# Patient Record
Sex: Male | Born: 2005 | Race: White | Hispanic: No | Marital: Single | Smoking: Never smoker
Health system: Southern US, Community
[De-identification: ages and names within clinical notes are randomized; demographics above are authoritative.]

## PROBLEM LIST (undated history)

## (undated) HISTORY — PX: OTHER SURGICAL HISTORY: SHX169

---
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
## 2024-09-21 NOTE — Progress Notes (Signed)
 Otolaryngology Clinic Note  HPI:    New Patient (Pt presents for bilateral ear infection. Swollen tonsils and sinus drainage )    Johnny Hanson is a 18 y.o. male who presents as a new patient, for evaluation and treatment of right ear clogging.  He has been experiencing a sensation of blockage in his right ear for approximately 3 to 4 weeks. He reports no preceding cold or congestion symptoms. He also reports difficulty hearing in the affected ear, describing it as feeling like there is an earplug inserted. He has a history of allergies and has undergone allergy testing in the past. He did not receive allergy injections during his childhood. His symptoms tend to worsen with changes in weather. He does not use any nasal sprays or allergy medications.   He has sought medical attention at an urgent care facility twice. During his first visit, he received a steroid injection due to swollen tonsils. On his second visit, which was about 1.5 weeks ago, he was prescribed prednisone  and an antibiotic for his ear condition, but these treatments did not alleviate his symptoms.  He is not currently using any intranasal steroid sprays.  He does not take Claritin or Zyrtec . He has a history of a broken nose from a fall on concrete when he was younger.  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  PMH/Meds/All/SocHx/FamHx/ROS:   Medical History[1]  Surgical History[2]  No family history of bleeding disorders, wound healing problems or difficulty with anesthesia.   Social History   Socioeconomic History   Marital status: Single    Spouse name: Not on file   Number of children: Not on file   Years of education: Not on file   Highest education level: Not on file  Occupational History   Not on file  Tobacco Use   Smoking status: Never   Smokeless tobacco: Never  Substance and Sexual Activity   Alcohol use: No   Drug use: Not on file   Sexual activity: Not on file  Other Topics Concern    Not on file  Social History Narrative   Not on file   Social Drivers of Health   Food Insecurity: Not on file  Transportation Needs: Not on file  Safety: Not on file  Living Situation: Not on file    Current Medications[3]  A complete ROS was performed with pertinent positives/negatives noted in the HPI. The remainder of the ROS are negative.    Physical Exam:    BP (!) 160/90   Pulse 101   Temp 97.5 F (36.4 C) (Temporal)   Ht 1.905 m (6' 3)   Wt (!) 176 kg (388 lb 9.6 oz)   BMI 48.57 kg/m    General: Well developed, well nourished. No acute distress.   Head/Face: Normocephalic, atraumatic. No scars or lesions. No sinus tenderness. Facial nerve intact and equal bilaterally.   Eyes: Pupils are equal, round and reactive to light. Conjunctiva and lids are normal. Normal extraocular mobility.   Ears:   Right: Pinna and external meatus normal, normal ear canal skin and caliber without excessive cerumen or drainage. Tympanic membrane intact with serous effusion.   Left: Pinna and external meatus normal, normal ear canal skin and caliber without excessive cerumen or drainage. Tympanic membrane intact without effusion or infection.   Nose: No gross deformity or lesions. No purulent discharge.   Mouth/Oropharynx: Lips normal, teeth and gums normal with good dentition, normal oral vestibule. Normal floor of mouth, tongue and oral mucosa, no  mucosal lesions, ulcer or mass, normal tongue mobility. Uvula midline. Hard and soft palate normal with normal mobility. 3+ tonsils, no erythema or exudate.  Larynx: See TFL if applicable  Nasopharynx: See TFL if applicable  Neck: Trachea midline. No masses. No crepitus. Normal thyroid glad palpation without thyromegaly or nodules palpated. Salivary glands normal to palpation without swelling, erythema or mass.   Lymphatic: No lymphadenopathy in the neck.  Respiratory: No stridor or distress.  Extremities: No edema or cyanosis. Warm and  well-perfused.  Neurologic: CN II-XII intact. Alert and oriented to self, place and time.  Normal reflexes and motor skills, balance and coordination. Moving all extremities without gross abnormality.  Psychiatric:  No unusual anxiety or evidence of depression. Appropriate affect.    Independent Review of Additional Tests or Records:  None  Procedures:   Binocular Microscopy  Date/Time: 09/21/2024 11:30 AM  Performed by: Gerard Jenkins Shope, DO Authorized by: Gerard Jenkins Shope, DO   Consent:    Consent obtained:  Verbal   Consent given by:  Patient Procedure details:    Indications: examination of tympanic membrane     Scope location: bilateral   Nasal/Sinus Endoscopy  Date/Time: 09/21/2024 11:30 AM  Performed by: Gerard Jenkins Shope, DO Authorized by: Gerard Jenkins Skotnicki, DO  Local anesthesia used: no  Anesthesia: Local anesthesia used: no  Sedation: Patient sedated: no  Comments: Procedure Note - Nasal Endoscopy   Risks/benefits and possible complications of this procedure were discussed in detail and the patient understood and agreed to proceed.  With the patient in the upright position, the flexible endscope was inserted into the nasal passage bilaterally.  Where visible, nasal secretions and mucosal crusting were removed with suction. The overall appearance of the nasal cavity and paranasal sinuses were noted and the findings are described below.  Findings: Septal deviation noted with bony spurring bilaterally, significant allergic edema with clear rhinorrhea.  No evidence of mucopurulent drainage or nasal polyps.  Fossa of Rosenmuller normal, nasopharynx normal   The patient tolerated the procedure without difficulty.         Impression & Plans:  Johnny Hanson is a 18 y.o. male with:  1. Right ear clogging. The right ear clogging is likely due to a recent cold or upper respiratory tract infection, rather than an active infection. The presence of  clear fluid behind the eardrum suggests that it may take up to 3 months for complete resolution. The previously prescribed steroids have been beneficial, but additional doses are not recommended at this time. The enlarged tonsils are not contributing to the ear drainage issue. A prescription for Flonase  nasal spray will be provided, to be used daily for the next few months. Gently popping the ears and using Zyrtec  or Claritin at night before bedtime is advised to help dry up the drainage. If the fluid does not clear on its own within the next few weeks, the option of inserting a tube in the ear to facilitate fluid drainage will be considered.  2. Deviated septum. The nasal septum is deviated, causing significant clear drainage and narrowing of the nasal passages on both sides. There are no polyps or masses present. The deviated septum may be contributing to the ear drainage issue. The use of Flonase  nasal spray daily for the next few months is recommended to reduce swelling and improve nasal breathing. If nasal congestion persists despite the use of Flonase , the option of septum correction will be discussed.  3.  Tonsillar hypertrophy Tonsils are 3+,  symmetric, with no evidence of inflammation, erythema or edema.  Patient was counseled that tonsillar hypertrophy as an isolated finding does not warrant any additional treatment or investigation.  However, patient may benefit from dedicated sleep study at some point in the future and to ensure there is not obstructive sleep apnea present.  A follow-up appointment is scheduled for 2 months from now.   Meghan Jenkins Shope, DO Otolaryngology         [1] No past medical history on file. [2] Past Surgical History: Procedure Laterality Date   ADENOIDECTOMY     Procedure: ADENOIDECTOMY   OTHER SURGICAL HISTORY     Procedure: OTHER SURGICAL HISTORY (Broke both arms)   OTHER SURGICAL HISTORY     Procedure: OTHER SURGICAL HISTORY (Broke arm);  Both arms, had to have left arm surgery  [3]  Current Outpatient Medications:    cetirizine  (ZyrTEC ) 10 mg tablet, Take  by mouth. (Patient not taking: Reported on 09/21/2024), Disp: , Rfl:    fluticasone  propionate (FLONASE ) 50 mcg/spray nasal spray, 2 sprays into each nostril once daily or 1 spray into each nostril twice daily., Disp: 16 g, Rfl: 11   mometasone (NASONEX) 50 mcg/actuation spry spray, Administer 2 sprays into affected nostril(s). (Patient not taking: Reported on 09/21/2024), Disp: , Rfl:

## 2025-01-05 NOTE — Progress Notes (Signed)
 Otolaryngology Clinic Note  HPI:    Enlarged Tonsils (Pt presents today for enlarged tonsils.)  History of Present Illness [name] is a 19 year old male is a male who presents for evaluation of swollen tonsils and sore throat for 2 days duration.  He is a return patient and is also accompanied by his girlfriend but is a provide his own history of today's exam.  He has a history of strep throat and has previously sought treatment at an urgent care facility for viral infections and sore throat. He was previously evaluated by Dr. Amaryllis, DO, who noted swollen tonsils but attributed this to his normal anatomy. The prescribed medication was initially effective, but the symptoms have since recurred. He reports experiencing significant swelling in his tonsils for the past 2 days, which has made eating and drinking difficult due to associated pain. This is the first instance of such pain. He also experienced a headache yesterday. He has not been in contact with any sick individuals and has never had a strep throat test.  PMH/Meds/All/SocHx/FamHx/ROS:   Medical History[1]  Surgical History[2]  No family history of bleeding disorders, wound healing problems or difficulty with anesthesia.   Social History   Socioeconomic History   Marital status: Single    Spouse name: Not on file   Number of children: Not on file   Years of education: Not on file   Highest education level: Not on file  Occupational History   Not on file  Tobacco Use   Smoking status: Never   Smokeless tobacco: Never  Substance and Sexual Activity   Alcohol use: No   Drug use: Not on file   Sexual activity: Not on file  Other Topics Concern   Not on file  Social History Narrative   Not on file   Social Drivers of Health   Living Situation: Not on file  Food Insecurity: Not on file  Transportation Needs: Not on file  Utilities: Not on file  Safety: Not on file  Alcohol Screening: Not on  file  Tobacco Use: Low Risk (09/21/2024)   Patient History    Smoking Tobacco Use: Never    Smokeless Tobacco Use: Never    Passive Exposure: Not on file  Depression: Not on file  Social Connections: Not on file  Financial Resource Strain: Not on file    Current Medications[3]  A complete ROS was performed with pertinent positives/negatives noted in the HPI. The remainder of the ROS are negative.    Physical Exam:    There were no vitals taken for this visit.   General: Well developed, well nourished. No acute distress. Voice normal.  Head/Face: Normocephalic, atraumatic. No scars or lesions. No sinus tenderness. Facial nerve intact and equal bilaterally. No facial lacerations. TMJ nontender to palpation.  Eyes: Pupils are equal, round and reactive to light. Conjunctiva and lids are normal. Normal extraocular mobility.   Ears:   Right: Pinna and external meatus normal, normal ear canal skin and caliber without excessive cerumen or drainage. Tympanic membrane intact serous effusion present but no acute otitis media.   Left: Pinna and external meatus normal, normal ear canal skin and caliber without excessive cerumen or drainage. Tympanic membrane intact without effusion or infection.   Hearing: Normal speech reception to conversational tone.  Nose: No gross deformity or lesions. No purulent discharge. Septum deviated, +2 no turbinate hypertrophy, allergic mucosa.  Mouth/Oropharynx: Lips normal, teeth and gums normal with good dentition, normal oral vestibule. Normal floor of  mouth, tongue and oral mucosa, no mucosal lesions, ulcer or mass, normal tongue mobility. Uvula midline. Hard and soft palate normal with normal mobility. +3 bilateral tonsils, no erythema. On Right tonsil there are two small circular spots but no exudate/drainage.  Larynx: See TFL if applicable  Nasopharynx: See TFL if applicable  Neck: Trachea midline. No masses. No crepitus. Normal thyroid gland palpation  without thyromegaly or nodules palpated. Salivary glands normal to palpation without swelling, erythema or mass.   Lymphatic: Mild cervical lymphadenopathy right neck, no lymphadenopathy left neck.  Respiratory: No stridor or distress.  Extremities: No edema or cyanosis. Warm and well-perfused.  Neurologic: CN II-XII intact. Alert and oriented to self, place and time.  Normal reflexes and motor skills, balance and coordination. Moving all extremities without gross abnormality.  Psychiatric:  No unusual anxiety or evidence of depression. Appropriate affect.    Physical Exam Throat: Tonsils are +3 in size but not quite touching. A few small white dots are observed on the right side. No abscess or drainage noted.  Mild cervical lymphadenopathy noted on the right side of his neck.  Independent Review of Additional Tests or Records:   Medical Records: Review of internal ENT records including records from Dr. Llewellyn in October 2025.  No external records reviewed.  1 new test ordered throat culture.  Procedures:   None  Impression & Plans:  Johnny Hanson is a 19 y.o. male with   1. Acute pharyngitis, unspecified etiology  Throat Culture   Throat Culture    2. Tonsillar hypertrophy      3. Allergic rhinitis, unspecified seasonality, unspecified trigger       Assessment & Plan 1.  Acute pharyngitis with baseline tonsillar hypertrophy and baseline allergic rhinitis Reports swollen tonsils for the past 2 days, with difficulty eating and drinking due to pain. Examination reveals tonsils that are +3 in size with a couple of small white dots on the right side, but no abscess or drainage.  Patient denies any history significant for strep throat and denies sick contacts.  However due to having in severe sore throat of 2 days duration suspect possible acute etiology viral versus bacterial acute pharyngitis.  Will get throat culture today.  At this time while awaiting throat culture conservative  measures are recommended including warm beverages, salt water gargles Cepacol or other numbing agents he may also use ibuprofen  or Tylenol  for pain.  Dr. Jesus is on-call and will be reviewing the throat culture results which are pending for this weekend.  For other concerns of baseline tonsillar hypertrophy and allergic rhinitis it was discussed by Dr. Llewellyn that use of over-the-counter Flonase  and Claritin or Zyrtec  should be considered.  If he has baseline congestion following this acute episode of pharyngitis he should have a follow-up consultation with Dr. Llewellyn to consider possible methods to improve his nasal breathing.  Medical Decision Making   Patient is here for a return patient visit for evaluation and treatment of:   A. 1 acute uncomplicated illnesses/conditions and 2 chronic conditions: level 3: 99213    B. Review and analysis of results:   1 unique test ordered throat culture. Low level of complexity for data analysis and review. Level 2: [phone]   C: Risk of Complications: Over-the-counter drugs or treatments or products were recommended, which poses a low risk of morbidity or mortality, level 3: 99213   Overall level of service: 99213  On the day of the visit I spent 22 minutes preparing to see  the patient, obtaining and/or reviewing separately obtained history, performing a medically appropriate examination and evaluation, and counseling and educating the patient/caregiver.This time does not include any time spent performing procedures or assesments that are separately billable.     Electronically signed by: Olam Vina Simpler, NP 01/05/2025 1:43 PM            [1] No past medical history on file. [2] Past Surgical History: Procedure Laterality Date   ADENOIDECTOMY     Procedure: ADENOIDECTOMY   OTHER SURGICAL HISTORY     Procedure: OTHER SURGICAL HISTORY (Broke both arms)   OTHER SURGICAL HISTORY     Procedure: OTHER SURGICAL HISTORY (Broke arm); Both  arms, had to have left arm surgery  [3]  Current Outpatient Medications:    cetirizine  (ZyrTEC ) 10 mg tablet, Take  by mouth. (Patient not taking: Reported on 09/21/2024), Disp: , Rfl:    fluticasone  propionate (FLONASE ) 50 mcg/spray nasal spray, 2 sprays into each nostril once daily or 1 spray into each nostril twice daily., Disp: 16 g, Rfl: 11   mometasone (NASONEX) 50 mcg/actuation spry spray, Administer 2 sprays into affected nostril(s). (Patient not taking: Reported on 09/21/2024), Disp: , Rfl:

## 2025-01-05 NOTE — Telephone Encounter (Signed)
 Pt was seen earlier today and his father would like to know if a medicine can be sent in to help with his throat swelling.Please call him at [phone].

## 2025-01-09 ENCOUNTER — Emergency Department (HOSPITAL_COMMUNITY)
Admission: EM | Admit: 2025-01-09 | Discharge: 2025-01-09 | Disposition: A | Discharge status: Home / Self Care | Attending: Emergency Medicine | Admitting: Emergency Medicine

## 2025-01-09 ENCOUNTER — Other Ambulatory Visit: Payer: Self-pay

## 2025-01-09 ENCOUNTER — Encounter (HOSPITAL_COMMUNITY): Payer: Self-pay

## 2025-01-09 DIAGNOSIS — J029 Acute pharyngitis, unspecified: Secondary | ICD-10-CM | POA: Diagnosis present

## 2025-01-09 LAB — GROUP A STREP BY PCR: Group A Strep by PCR: NOT DETECTED

## 2025-01-09 MED ORDER — LIDOCAINE VISCOUS HCL 2 % MT SOLN: 15.0000 mL | Freq: Four times a day (QID) | OROMUCOSAL | 0 refills | Status: AC | PRN

## 2025-01-09 MED ORDER — DEXAMETHASONE SOD PHOSPHATE PF 10 MG/ML IJ SOLN
10.0000 mg | Freq: Once | INTRAMUSCULAR | Status: AC
  Administered 2025-01-09: 10 mg via INTRAMUSCULAR
  Filled 2025-01-09: qty 1

## 2025-01-09 MED ORDER — LIDOCAINE VISCOUS HCL 2 % MT SOLN
15.0000 mL | Freq: Once | OROMUCOSAL | Status: AC
  Administered 2025-01-09: 15 mL via OROMUCOSAL
  Filled 2025-01-09: qty 15

## 2025-01-09 NOTE — ED Triage Notes (Signed)
 Pt states that he has had a sore throat and swollen tonsils x few days. Fevers at home.

## 2025-01-09 NOTE — ED Provider Notes (Signed)
 "  [place] EMERGENCY DEPARTMENT AT [facility]  Provider Note  CSN: [id number] Arrival date & time: 01/09/25 0341  History Chief Complaint  Patient presents with   Sore Throat    Johnny Hanson is a 19 y.o. male with history of allergies and tonsillar hypertrophy here with several days of sore throat and fever. Seen at ENT on 1/23 and had throat culture sent (but no swab). Told he could start taking some left over doxycycline he had at home but symptoms have not gotten better. Minimal cough, some nasal congestion.    Home Medications Prior to Admission medications  Medication Sig Start Date End Date Taking? Authorizing Provider  amoxicillin  (AMOXIL ) 875 MG tablet Take 1 tablet (875 mg total) by mouth 2 (two) times daily. 02/05/22   Christopher Savannah, PA-C  fluticasone  (FLONASE ) 50 MCG/ACT nasal spray Place 1 spray into both nostrils 2 (two) times daily. 10/04/22   Stuart Vernell Norris, PA-C  levocetirizine (XYZAL ) 5 MG tablet Take 1 tablet (5 mg total) by mouth every evening. 10/04/22   Stuart Vernell Norris, PA-C  lidocaine  (XYLOCAINE ) 2 % solution Use as directed 15 mLs in the mouth or throat every 6 (six) hours as needed (sore throat). 01/09/25   Roselyn Carlin NOVAK, MD  predniSONE  (DELTASONE ) 20 MG tablet Take 2 tablets daily with breakfast. 02/05/22   Christopher Savannah, PA-C  predniSONE  (DELTASONE ) 20 MG tablet Take 2 tablets (40 mg total) by mouth daily with breakfast. 10/04/22   Stuart Vernell Norris, PA-C     Allergies    Patient has no known allergies.   Review of Systems   Review of Systems Please see HPI for pertinent positives and negatives  Physical Exam BP 129/75   Pulse (!) 103   Temp 98.7 F (37.1 C) (Oral)   Resp 16   SpO2 95%   Physical Exam Vitals and nursing note reviewed.  Constitutional:      Appearance: Normal appearance.  HENT:     Head: Normocephalic and atraumatic.     Nose: Nose normal.     Mouth/Throat:     Mouth: Mucous membranes are  moist.     Pharynx: Uvula midline. No uvula swelling.     Tonsils: Tonsillar exudate present. No tonsillar abscesses. 3+ on the right. 3+ on the left.  Eyes:     Extraocular Movements: Extraocular movements intact.     Conjunctiva/sclera: Conjunctivae normal.  Cardiovascular:     Rate and Rhythm: Normal rate.  Pulmonary:     Effort: Pulmonary effort is normal.     Breath sounds: Normal breath sounds.  Abdominal:     General: Abdomen is flat.     Palpations: Abdomen is soft.     Tenderness: There is no abdominal tenderness.  Musculoskeletal:        General: No swelling. Normal range of motion.     Cervical back: Neck supple.  Lymphadenopathy:     Cervical: No cervical adenopathy.  Skin:    General: Skin is warm and dry.  Neurological:     General: No focal deficit present.     Mental Status: He is alert.  Psychiatric:        Mood and Affect: Mood normal.     ED Results / Procedures / Treatments   EKG None  Procedures Procedures  Medications Ordered in the ED Medications  dexamethasone  (DECADRON ) injection 10 mg (has no administration in time range)  lidocaine  (XYLOCAINE ) 2 % viscous mouth solution 15 mL (has  no administration in time range)    Initial Impression and Plan  Patient here with sore throat, exam is concerning for strep, outpatient throat culture not yet resulted in Epic. Will check swab here and treat accordingly.   ED Course   Clinical Course as of 01/09/25 0531  Tue Jan 09, 2025  9470 Strep is neg, will give a dose of decadron  and viscous lidocaine  for symptomatic relief. Recommend outpatient ENT follow up. RTED for any other concerns.  [CS]    Clinical Course User Index [CS] Roselyn Carlin NOVAK, MD     MDM Rules/Calculators/A&P Medical Decision Making Problems Addressed: Pharyngitis, unspecified etiology: acute illness or injury  Amount and/or Complexity of Data Reviewed Labs: ordered. Decision-making details documented in ED  Course.  Risk Prescription drug management.     Final Clinical Impression(s) / ED Diagnoses Final diagnoses:  Pharyngitis, unspecified etiology    Rx / DC Orders ED Discharge Orders          Ordered    lidocaine  (XYLOCAINE ) 2 % solution  Every 6 hours PRN        01/09/25 0530             Roselyn Carlin NOVAK, MD 01/09/25 [phone]  "
